# Patient Record
Sex: Female | Born: 1935 | Race: Black or African American | Hispanic: No | Smoking: Former smoker
Health system: Southern US, Community
[De-identification: ages and names within clinical notes are randomized; demographics above are authoritative.]

## PROBLEM LIST (undated history)

## (undated) DIAGNOSIS — I10 Essential (primary) hypertension: Secondary | ICD-10-CM

## (undated) DIAGNOSIS — E7800 Pure hypercholesterolemia, unspecified: Secondary | ICD-10-CM

## (undated) DIAGNOSIS — G4730 Sleep apnea, unspecified: Secondary | ICD-10-CM

## (undated) HISTORY — PX: TUBAL LIGATION: SHX77

---

## 2015-04-15 ENCOUNTER — Emergency Department (HOSPITAL_BASED_OUTPATIENT_CLINIC_OR_DEPARTMENT_OTHER): Payer: Medicare Other

## 2015-04-15 ENCOUNTER — Emergency Department (HOSPITAL_BASED_OUTPATIENT_CLINIC_OR_DEPARTMENT_OTHER)
Admission: EM | Admit: 2015-04-15 | Discharge: 2015-04-15 | Disposition: A | Discharge status: Home / Self Care | Payer: Medicare Other | Attending: Emergency Medicine | Admitting: Emergency Medicine

## 2015-04-15 ENCOUNTER — Encounter (HOSPITAL_BASED_OUTPATIENT_CLINIC_OR_DEPARTMENT_OTHER): Payer: Self-pay

## 2015-04-15 DIAGNOSIS — E7800 Pure hypercholesterolemia, unspecified: Secondary | ICD-10-CM | POA: Insufficient documentation

## 2015-04-15 DIAGNOSIS — Z79899 Other long term (current) drug therapy: Secondary | ICD-10-CM | POA: Insufficient documentation

## 2015-04-15 DIAGNOSIS — Y9389 Activity, other specified: Secondary | ICD-10-CM | POA: Insufficient documentation

## 2015-04-15 DIAGNOSIS — Z8669 Personal history of other diseases of the nervous system and sense organs: Secondary | ICD-10-CM | POA: Insufficient documentation

## 2015-04-15 DIAGNOSIS — W19XXXA Unspecified fall, initial encounter: Secondary | ICD-10-CM

## 2015-04-15 DIAGNOSIS — M25512 Pain in left shoulder: Secondary | ICD-10-CM

## 2015-04-15 DIAGNOSIS — Y998 Other external cause status: Secondary | ICD-10-CM | POA: Insufficient documentation

## 2015-04-15 DIAGNOSIS — S80212A Abrasion, left knee, initial encounter: Secondary | ICD-10-CM | POA: Diagnosis not present

## 2015-04-15 DIAGNOSIS — I10 Essential (primary) hypertension: Secondary | ICD-10-CM | POA: Diagnosis not present

## 2015-04-15 DIAGNOSIS — T148 Other injury of unspecified body region: Secondary | ICD-10-CM | POA: Diagnosis not present

## 2015-04-15 DIAGNOSIS — S50312A Abrasion of left elbow, initial encounter: Secondary | ICD-10-CM | POA: Diagnosis not present

## 2015-04-15 DIAGNOSIS — S4992XA Unspecified injury of left shoulder and upper arm, initial encounter: Secondary | ICD-10-CM | POA: Diagnosis not present

## 2015-04-15 DIAGNOSIS — Y92410 Unspecified street and highway as the place of occurrence of the external cause: Secondary | ICD-10-CM | POA: Diagnosis not present

## 2015-04-15 DIAGNOSIS — R911 Solitary pulmonary nodule: Secondary | ICD-10-CM | POA: Insufficient documentation

## 2015-04-15 DIAGNOSIS — T148XXA Other injury of unspecified body region, initial encounter: Secondary | ICD-10-CM

## 2015-04-15 HISTORY — DX: Essential (primary) hypertension: I10

## 2015-04-15 HISTORY — DX: Pure hypercholesterolemia, unspecified: E78.00

## 2015-04-15 HISTORY — DX: Sleep apnea, unspecified: G47.30

## 2015-04-15 MED ORDER — CYCLOBENZAPRINE HCL 10 MG PO TABS
10.0000 mg | ORAL_TABLET | Freq: Once | ORAL | Status: AC
  Administered 2015-04-15: 10 mg via ORAL
  Filled 2015-04-15: qty 1

## 2015-04-15 MED ORDER — CYCLOBENZAPRINE HCL 10 MG PO TABS: 10.0000 mg | ORAL_TABLET | Freq: Two times a day (BID) | ORAL | Status: AC | PRN

## 2015-04-15 NOTE — ED Notes (Signed)
D/c home with family. Rx x 1 given for flexeril. Ice pack given for home use

## 2015-04-15 NOTE — ED Notes (Signed)
Patient fell while getting out of car this am falling on left knee and left shoulder, pain with any ambulation

## 2015-04-15 NOTE — Discharge Instructions (Signed)
Discontinue your other muscle relaxant (tizanidine/zanaflex)  if you are going to take this one (flexeril.)    Heat Therapy Heat therapy can help ease sore, stiff, injured, and tight muscles and joints. Heat relaxes your muscles, which may help ease your pain.  RISKS AND COMPLICATIONS If you have any of the following conditions, do not use heat therapy unless your health care provider has approved:  Poor circulation.  Healing wounds or scarred skin in the area being treated.  Diabetes, heart disease, or high blood pressure.  Not being able to feel (numbness) the area being treated.  Unusual swelling of the area being treated.  Active infections.  Blood clots.  Cancer.  Inability to communicate pain. This may include young children and people who have problems with their brain function (dementia).  Pregnancy. Heat therapy should only be used on old, pre-existing, or long-lasting (chronic) injuries. Do not use heat therapy on new injuries unless directed by your health care provider. HOW TO USE HEAT THERAPY There are several different kinds of heat therapy, including:  Moist heat pack.  Warm water bath.  Hot water bottle.  Electric heating pad.  Heated gel pack.  Heated wrap.  Electric heating pad. Use the heat therapy method suggested by your health care provider. Follow your health care provider's instructions on when and how to use heat therapy. GENERAL HEAT THERAPY RECOMMENDATIONS  Do not sleep while using heat therapy. Only use heat therapy while you are awake.  Your skin may turn pink while using heat therapy. Do not use heat therapy if your skin turns red.  Do not use heat therapy if you have new pain.  High heat or long exposure to heat can cause burns. Be careful when using heat therapy to avoid burning your skin.  Do not use heat therapy on areas of your skin that are already irritated, such as with a rash or sunburn. SEEK MEDICAL CARE IF:  You have  blisters, redness, swelling, or numbness.  You have new pain.  Your pain is worse. MAKE SURE YOU:  Understand these instructions.  Will watch your condition.  Will get help right away if you are not doing well or get worse.   This information is not intended to replace advice given to you by your health care provider. Make sure you discuss any questions you have with your health care provider.   Document Released: 04/14/2011 Document Revised: 02/10/2014 Document Reviewed: 03/15/2013 Elsevier Interactive Patient Education [employer]2016 [employer] Inc.

## 2015-04-15 NOTE — ED Provider Notes (Signed)
CSN: [id number]     Arrival date & time 04/15/15  1159 History   First MD Initiated Contact with Patient 04/15/15 1414     Chief Complaint  Patient presents with  . Fall     (Consider location/radiation/quality/duration/timing/severity/associated sxs/prior Treatment) HPI Comments: Getting out of the car at 11AM Then fell No LOC No head trauma, no blood thinners Fell on left knee and left shoulder Helped get up and get back into car Shoulder pain 10/10   Patient is a 81 y.o. female presenting with fall.  Fall Pertinent negatives include no chest pain, no abdominal pain, no headaches and no shortness of breath.    Past Medical History  Diagnosis Date  . Hypertension   . High cholesterol   . Sleep apnea    History reviewed. No pertinent past surgical history. No family history on file. Social History  Substance Use Topics  . Smoking status: Never Smoker   . Smokeless tobacco: None  . Alcohol Use: None   OB History    No data available     Review of Systems  Constitutional: Negative for fever.  HENT: Positive for rhinorrhea (allergies taking allergy pills). Negative for sore throat.   Eyes: Negative for visual disturbance.  Respiratory: Negative for cough and shortness of breath.   Cardiovascular: Negative for chest pain.  Gastrointestinal: Negative for nausea, vomiting and abdominal pain.  Genitourinary: Negative for difficulty urinating.  Musculoskeletal: Positive for arthralgias. Negative for back pain and neck pain.  Skin: Negative for rash.  Neurological: Negative for syncope, facial asymmetry, speech difficulty, weakness, numbness and headaches.      Allergies  Review of patient's allergies indicates not on file.  Home Medications   Prior to Admission medications   Medication Sig Start Date End Date Taking? Authorizing Provider  acetaminophen (TYLENOL) 325 MG tablet Take 650 mg by mouth every 6 (six) hours as needed.   Yes Historical Provider, MD   cyclobenzaprine (FLEXERIL) 10 MG tablet Take 1 tablet (10 mg total) by mouth 2 (two) times daily as needed for muscle spasms. 04/15/15   Alvira MondayErin Ryen Rhames, MD  lisinopril (PRINIVIL,ZESTRIL) 30 MG tablet Take 30 mg by mouth daily.   Yes Historical Provider, MD  simvastatin (ZOCOR) 20 MG tablet Take 20 mg by mouth daily.   Yes Historical Provider, MD   BP 149/94 mmHg  Pulse 82  Temp(Src) 98.6 F (37 C) (Oral)  Resp 20  Ht 5\' 5"  (1.651 m)  Wt 200 lb (90.719 kg)  BMI 33.28 kg/m2  SpO2 100% Physical Exam  Constitutional: She is oriented to person, place, and time. She appears well-developed and well-nourished. No distress.  HENT:  Head: Normocephalic and atraumatic.  Eyes: Conjunctivae and EOM are normal.  Neck: Normal range of motion.  Cardiovascular: Normal rate, regular rhythm, normal heart sounds and intact distal pulses.  Exam reveals no gallop and no friction rub.   No murmur heard. Pulmonary/Chest: Effort normal and breath sounds normal. No respiratory distress. She has no wheezes. She has no rales.  Abdominal: Soft. She exhibits no distension. There is no tenderness. There is no guarding.  Musculoskeletal: She exhibits no edema.       Left shoulder: She exhibits decreased range of motion (flexion at shoulder, pain with abduction), tenderness, bony tenderness and spasm. She exhibits no effusion, no deformity, no laceration and normal strength.       Left elbow: She exhibits normal range of motion, no swelling, no effusion and no deformity. No tenderness found.  Left wrist: She exhibits normal range of motion, no tenderness and no bony tenderness.       Cervical back: She exhibits tenderness (left trapezius smas, tenderness). She exhibits normal range of motion and no bony tenderness.  Neurological: She is alert and oriented to person, place, and time. She has normal strength. No cranial nerve deficit or sensory deficit. Coordination normal. GCS eye subscore is 4. GCS verbal subscore  is 5. GCS motor subscore is 6.  Skin: Skin is warm and dry. Abrasion (left knee, left elbow) noted. No rash noted. She is not diaphoretic. No erythema.  Nursing note and vitals reviewed.   ED Course  Procedures (including critical care time) Labs Review Labs Reviewed - No data to display  Imaging Review Dg Chest 2 View  04/15/2015  CLINICAL DATA:  With 2 week history of productive cough. EXAM: CHEST  2 VIEW COMPARISON:  None. FINDINGS: Two-view exam shows hyperexpansion with mild chronic interstitial coarsening. 15 mm oval density identified in the right parahilar lung. Nodular density overlying the anterior left first rib is probably related to mineralized costochondral cartilage. There is some scarring in the left mid lung and left lung base. No edema or focal airspace consolidation. Cardiopericardial silhouette is at upper limits of normal for size. The visualized bony structures of the thorax are intact. IMPRESSION: Right parahilar nodule. CT chest without contrast recommended to further evaluate. Electronically Signed   By: Kennith Center M.D.   On: 04/15/2015 15:19   Dg Shoulder Left  04/15/2015  CLINICAL DATA:  81 year old female with history of fall today while getting out of a car complaining of left shoulder and left knee pain. EXAM: LEFT SHOULDER - 2+ VIEW COMPARISON:  No priors. FINDINGS: Mild irregularity of the inferior aspect of the glenoid, favored to be degenerative. No definite acute displaced fracture, subluxation or dislocation. Mild joint space narrowing, subchondral sclerosis and osteophyte formation associated with the glenohumeral joint, compatible with mild osteoarthritis. IMPRESSION: 1. No acute radiographic abnormality of the left shoulder. 2. Mild left glenohumeral joint osteoarthritis. Electronically Signed   By: Trudie Reed M.D.   On: 04/15/2015 15:21   Dg Knee Complete 4 Views Left  04/15/2015  CLINICAL DATA:  Fall today getting out of car.  Left knee pain. EXAM:  LEFT KNEE - COMPLETE 4+ VIEW COMPARISON:  None. FINDINGS: Advanced degenerative changes within all 3 compartments of the left knee. No acute bony abnormality. Specifically, no fracture, subluxation, or dislocation. Soft tissues are intact. No joint effusion. IMPRESSION: Advanced degenerative changes.  No acute bony abnormality. Electronically Signed   By: Charlett Nose M.D.   On: 04/15/2015 15:20   I have personally reviewed and evaluated these images and lab results as part of my medical decision-making.   EKG Interpretation None      MDM   Final diagnoses:  Fall, initial encounter  Left shoulder pain  Contusion  Nodule of right lung   81yo female with history of hypertension, hyperlipidemia, OSA, presents with concern for mechanical fall from standing with left shoulder pain.  Patient denies LOC, head trauma, headache, nausea/vomiting, numbness/weakness, midline neck pain and doubt acute intracranial or intracervical pathology.  Patient with pain in left shoulder and left knee from fall.  Reports mild cough as well. XR ordered showing no signs of pneumonia, however did show pulmonary nodule for which we recommend close PCP follow up for outpatient CT.   XR of shoulder and knee without acute fracture.  Patient with likely contusion, muscle  strain, possible rotator cuff injury.  Patient neurovascularly intact.  Given flexeril with improvement.  Given shoulder sling for comfort.  Patient discharged in stable condition with understanding of reasons to return and flexeril rx.      Alvira Monday, MD 04/15/15 2125

## 2015-04-15 NOTE — ED Notes (Signed)
MD at bedside. 

## 2017-05-20 ENCOUNTER — Encounter (HOSPITAL_BASED_OUTPATIENT_CLINIC_OR_DEPARTMENT_OTHER): Payer: Self-pay | Admitting: *Deleted

## 2017-05-20 ENCOUNTER — Emergency Department (HOSPITAL_BASED_OUTPATIENT_CLINIC_OR_DEPARTMENT_OTHER)
Admission: EM | Admit: 2017-05-20 | Discharge: 2017-05-20 | Disposition: A | Discharge status: Home / Self Care | Payer: Medicare HMO | Attending: Emergency Medicine | Admitting: Emergency Medicine

## 2017-05-20 ENCOUNTER — Emergency Department (HOSPITAL_BASED_OUTPATIENT_CLINIC_OR_DEPARTMENT_OTHER): Payer: Medicare HMO

## 2017-05-20 ENCOUNTER — Other Ambulatory Visit: Payer: Self-pay

## 2017-05-20 DIAGNOSIS — Z87891 Personal history of nicotine dependence: Secondary | ICD-10-CM | POA: Insufficient documentation

## 2017-05-20 DIAGNOSIS — I10 Essential (primary) hypertension: Secondary | ICD-10-CM | POA: Diagnosis not present

## 2017-05-20 DIAGNOSIS — Y929 Unspecified place or not applicable: Secondary | ICD-10-CM | POA: Insufficient documentation

## 2017-05-20 DIAGNOSIS — Z79899 Other long term (current) drug therapy: Secondary | ICD-10-CM | POA: Diagnosis not present

## 2017-05-20 DIAGNOSIS — Y939 Activity, unspecified: Secondary | ICD-10-CM | POA: Insufficient documentation

## 2017-05-20 DIAGNOSIS — Y999 Unspecified external cause status: Secondary | ICD-10-CM | POA: Insufficient documentation

## 2017-05-20 DIAGNOSIS — S0003XA Contusion of scalp, initial encounter: Secondary | ICD-10-CM | POA: Diagnosis not present

## 2017-05-20 DIAGNOSIS — W19XXXA Unspecified fall, initial encounter: Secondary | ICD-10-CM | POA: Diagnosis not present

## 2017-05-20 NOTE — ED Triage Notes (Addendum)
Pt c/o fall from standing landing on cement hitting head, ? LOC , daughter states she was confused after the fall

## 2017-05-20 NOTE — ED Provider Notes (Signed)
[facility] EMERGENCY DEPARTMENT Provider Note   CSN: [id number] Arrival date & time: 05/20/17  2104     History   Chief Complaint Chief Complaint  Patient presents with  . Fall    HPI Holly Hess is a 83 y.o. female.  HPI Patient presents after fall.  Was in her kitchen then woke up on the floor.  Hit the back of her head.  Mild confusion after.  Quickly came back to normal however.  Does have a history of falls.  Had been doing well before.  No chest pain.  No trouble breathing.  No lightheadedness.  Has had falls before.  No history of syncope.  States she has some swelling on the back of her head.  Has been doing well recently.  No lightheadedness or dizziness. Past Medical History:  Diagnosis Date  . High cholesterol   . Hypertension   . Sleep apnea     There are no active problems to display for this patient.   Past Surgical History:  Procedure Laterality Date  . TUBAL LIGATION       OB History   None      Home Medications    Prior to Admission medications   Medication Sig Start Date End Date Taking? Authorizing Provider  acetaminophen (TYLENOL) 325 MG tablet Take 650 mg by mouth every 6 (six) hours as needed.    [provider]  cyclobenzaprine (FLEXERIL) 10 MG tablet Take 1 tablet (10 mg total) by mouth 2 (two) times daily as needed for muscle spasms. 04/15/15   Alvira Monday, MD  lisinopril (PRINIVIL,ZESTRIL) 30 MG tablet Take 30 mg by mouth daily.    [provider]  simvastatin (ZOCOR) 20 MG tablet Take 20 mg by mouth daily.    [provider]    Family History History reviewed. No pertinent family history.  Social History Social History   Tobacco Use  . Smoking status: Former Games developer  . Smokeless tobacco: Never Used  Substance Use Topics  . Alcohol use: Never    Frequency: Never  . Drug use: Never     Allergies   Patient has no known allergies.   Review of Systems Review of Systems    Constitutional: Negative for appetite change and fever.  HENT: Negative for congestion.   Respiratory: Negative for shortness of breath.   Gastrointestinal: Negative for abdominal pain.  Genitourinary: Negative for flank pain.  Musculoskeletal: Negative for neck stiffness.  Skin: Negative for wound.  Neurological: Positive for syncope. Negative for headaches.  Hematological: Negative for adenopathy.  Psychiatric/Behavioral: Negative for confusion.     Physical Exam Updated Vital Signs BP (!) 190/94 (BP Location: Left Arm)   Pulse 72   Temp 98.6 F (37 C) (Oral)   Resp 16   Ht 5\' 6"  (1.676 m)   Wt 88.9 kg (196 lb)   SpO2 100%   BMI 31.64 kg/m   Physical Exam  Constitutional: She appears well-developed.  HENT:  Head: Normocephalic.  Occipital hematoma.  Eyes: EOM are normal.  Neck: Neck supple.  Cardiovascular: Normal rate.  Pulmonary/Chest: Effort normal.  Abdominal: There is no tenderness.  Musculoskeletal: She exhibits no edema or tenderness.  Neurological: She is alert.  Skin: Skin is warm. Capillary refill takes less than 2 seconds.  Psychiatric: She has a normal mood and affect.     ED Treatments / Results  Labs (all labs ordered are listed, but only abnormal results are displayed) Labs Reviewed - No data  to display  EKG EKG Interpretation  Date/Time:  Wednesday May 20 2017 21:48:16 EDT Ventricular Rate:  74 PR Interval:    QRS Duration: 78 QT Interval:  377 QTC Calculation: 419 R Axis:   1 Text Interpretation:  Sinus rhythm Consider left atrial enlargement Low voltage, precordial leads Abnormal R-wave progression, early transition Confirmed by Benjiman CorePickering, Apostolos Blagg [phone]) on 05/20/2017 10:43:23 PM   Radiology Ct Head Wo Contrast  Result Date: 05/20/2017 CLINICAL DATA:  Status post fall, hitting head on cement. Question of loss of consciousness. Confusion and posterior left headache. EXAM: CT HEAD WITHOUT CONTRAST TECHNIQUE: Contiguous axial images  were obtained from the base of the skull through the vertex without intravenous contrast. COMPARISON:  None. FINDINGS: Brain: No evidence of acute infarction, hemorrhage, hydrocephalus, extra-axial collection or mass lesion / mass effect. Prominence of the ventricles and sulci reflects mild cortical volume loss. Mild cerebellar atrophy is noted. Scattered periventricular white matter change likely reflects small vessel ischemic microangiopathy. The brainstem and fourth ventricle are within normal limits. The basal ganglia are unremarkable in appearance. The cerebral hemispheres demonstrate grossly normal gray-white differentiation. No mass effect or midline shift is seen. Vascular: No hyperdense vessel or unexpected calcification. Skull: There is no evidence of fracture; visualized osseous structures are unremarkable in appearance. Sinuses/Orbits: The orbits are within normal limits. The paranasal sinuses and mastoid air cells are well-aerated. Other: Diffuse soft tissue swelling is noted at the posterior vertex and occiput. IMPRESSION: 1. No acute intracranial pathology seen on CT. 2. Diffuse soft tissue swelling at the posterior vertex and occiput. 3. Mild cortical volume loss and scattered small vessel ischemic microangiopathy. Electronically Signed   By: Roanna RaiderJeffery  Chang M.D.   On: 05/20/2017 22:13    Procedures Procedures (including critical care time)  Medications Ordered in ED Medications - No data to display   Initial Impression / Assessment and Plan / ED Course  I have reviewed the triage vital signs and the nursing notes.  Pertinent labs & imaging results that were available during my care of the patient were reviewed by me and considered in my medical decision making (see chart for details).     Patient with fall.  I think more likely a mechanical fall followed by confusion and some amnesia.  I think left likely it was a syncope.  EKG reassuring.  Discussed with patient and family and will  do a somewhat limited workup.  Has had no symptoms before.  Will discharge home.  Patient is  at her baseline.  Final Clinical Impressions(s) / ED Diagnoses   Final diagnoses:  Fall, initial encounter  Hematoma of scalp, initial encounter    ED Discharge Orders    None       Benjiman CorePickering, Jaylynn Mcaleer, MD 05/20/17 2315

## 2017-09-06 ENCOUNTER — Other Ambulatory Visit: Payer: Self-pay

## 2017-09-06 ENCOUNTER — Encounter (HOSPITAL_BASED_OUTPATIENT_CLINIC_OR_DEPARTMENT_OTHER): Payer: Self-pay | Admitting: Emergency Medicine

## 2017-09-06 ENCOUNTER — Emergency Department (HOSPITAL_BASED_OUTPATIENT_CLINIC_OR_DEPARTMENT_OTHER)
Admission: EM | Admit: 2017-09-06 | Discharge: 2017-09-06 | Disposition: A | Discharge status: Home / Self Care | Payer: Medicare HMO | Attending: Emergency Medicine | Admitting: Emergency Medicine

## 2017-09-06 ENCOUNTER — Emergency Department (HOSPITAL_BASED_OUTPATIENT_CLINIC_OR_DEPARTMENT_OTHER): Payer: Medicare HMO

## 2017-09-06 DIAGNOSIS — Z79899 Other long term (current) drug therapy: Secondary | ICD-10-CM | POA: Insufficient documentation

## 2017-09-06 DIAGNOSIS — M25562 Pain in left knee: Secondary | ICD-10-CM | POA: Diagnosis not present

## 2017-09-06 DIAGNOSIS — Z87891 Personal history of nicotine dependence: Secondary | ICD-10-CM | POA: Diagnosis not present

## 2017-09-06 DIAGNOSIS — I10 Essential (primary) hypertension: Secondary | ICD-10-CM | POA: Insufficient documentation

## 2017-09-06 DIAGNOSIS — M1710 Unilateral primary osteoarthritis, unspecified knee: Secondary | ICD-10-CM

## 2017-09-06 MED ORDER — IBUPROFEN 600 MG PO TABS: 600.0000 mg | ORAL_TABLET | Freq: Four times a day (QID) | ORAL | 0 refills | Status: AC | PRN

## 2017-09-06 MED ORDER — IBUPROFEN 800 MG PO TABS
800.0000 mg | ORAL_TABLET | Freq: Once | ORAL | Status: AC
  Administered 2017-09-06: 800 mg via ORAL
  Filled 2017-09-06: qty 1

## 2017-09-06 MED ORDER — TRAMADOL HCL 50 MG PO TABS
50.0000 mg | ORAL_TABLET | Freq: Once | ORAL | Status: AC
  Administered 2017-09-06: 50 mg via ORAL
  Filled 2017-09-06: qty 1

## 2017-09-06 MED ORDER — TRAMADOL HCL 50 MG PO TABS: 50.0000 mg | ORAL_TABLET | Freq: Four times a day (QID) | ORAL | 0 refills | Status: AC | PRN

## 2017-09-06 NOTE — ED Provider Notes (Signed)
[facility] EMERGENCY DEPARTMENT Provider Note   CSN: [id number] Arrival date & time: 09/06/17  1849     History   Chief Complaint Chief Complaint  Patient presents with  . Knee Pain    HPI Holly Hess is a 83 y.o. female hx of HTN, HL here with L knee pain. L knee pain for the last week. She saw her primary care doctor and was diagnosed with gout. She states that she was prescribed some medicines but can't remember what they were and they didn't help her with the pain. Denies any recent trama or injury. Denies any fever or chills.   The history is provided by the patient.    Past Medical History:  Diagnosis Date  . High cholesterol   . Hypertension   . Sleep apnea     There are no active problems to display for this patient.   Past Surgical History:  Procedure Laterality Date  . TUBAL LIGATION       OB History   None      Home Medications    Prior to Admission medications   Medication Sig Start Date End Date Taking? Authorizing Provider  acetaminophen (TYLENOL) 325 MG tablet Take 650 mg by mouth every 6 (six) hours as needed.    [provider]  cyclobenzaprine (FLEXERIL) 10 MG tablet Take 1 tablet (10 mg total) by mouth 2 (two) times daily as needed for muscle spasms. 04/15/15   Alvira Monday, MD  lisinopril (PRINIVIL,ZESTRIL) 30 MG tablet Take 30 mg by mouth daily.    [provider]  simvastatin (ZOCOR) 20 MG tablet Take 20 mg by mouth daily.    [provider]    Family History No family history on file.  Social History Social History   Tobacco Use  . Smoking status: Former Games developer  . Smokeless tobacco: Never Used  Substance Use Topics  . Alcohol use: Never    Frequency: Never  . Drug use: Never     Allergies   Patient has no known allergies.   Review of Systems Review of Systems  Musculoskeletal:       L knee pain   All other systems reviewed and are negative.    Physical Exam Updated Vital  Signs BP (!) 174/83 (BP Location: Right Arm)   Pulse 62   Temp 98.4 F (36.9 C) (Oral)   Resp 20   Ht 5\' 6"  (1.676 m)   Wt 86.2 kg (190 lb)   SpO2 99%   BMI 30.67 kg/m   Physical Exam  Constitutional: She appears well-developed.  HENT:  Head: Normocephalic.  Eyes: Pupils are equal, round, and reactive to light. Conjunctivae and EOM are normal.  Neck: Normal range of motion.  Cardiovascular: Normal rate.  Pulmonary/Chest: Effort normal.  Abdominal: Soft.  Musculoskeletal:  L knee slightly swollen, nl ROM, no erythema. No calf tenderness   Neurological: She is alert.  Skin: Skin is warm.  Psychiatric: She has a normal mood and affect.  Nursing note and vitals reviewed.    ED Treatments / Results  Labs (all labs ordered are listed, but only abnormal results are displayed) Labs Reviewed - No data to display  EKG None  Radiology Dg Knee Complete 4 Views Left  Result Date: 09/06/2017 CLINICAL DATA:  LEFT knee pain, no new injury EXAM: LEFT KNEE - COMPLETE 4+ VIEW COMPARISON:  04/15/2015 FINDINGS: Osseous demineralization. Tricompartmental osteoarthritic changes with joint space narrowing and spur formation least severe at the lateral  compartment. No acute fracture, dislocation, or bone destruction. No knee joint effusion. Scattered atherosclerotic calcifications. IMPRESSION: Osseous demineralization with tricompartmental osteoarthritic changes of the LEFT knee. No acute abnormalities. Electronically Signed   By: Ulyses SouthwardMark  Boles M.D.   On: 09/06/2017 22:42    Procedures Procedures (including critical care time)  Medications Ordered in ED Medications  traMADol (ULTRAM) tablet 50 mg (50 mg Oral Given 09/06/17 2126)  ibuprofen (ADVIL,MOTRIN) tablet 800 mg (800 mg Oral Given 09/06/17 2126)     Initial Impression / Assessment and Plan / ED Course  I have reviewed the triage vital signs and the nursing notes.  Pertinent labs & imaging results that were available during my care of  the patient were reviewed by me and considered in my medical decision making (see chart for details).     Holly Hess is a 83 y.o. female here with L knee pain. Xray showed arthritis. I think pain likely from arthritis. No evidence of gout or septic knee. Will dc home with motrin, ultram. Will have her follow up with ortho.    Final Clinical Impressions(s) / ED Diagnoses   Final diagnoses:  None    ED Discharge Orders    None       Charlynne PanderYao, Kylynn Street Hsienta, MD 09/06/17 2250

## 2017-09-06 NOTE — ED Notes (Signed)
Pt helped to bathroom. Walked w/o assistance. NAD.

## 2017-09-06 NOTE — ED Triage Notes (Signed)
L knee pain for several weeks. Denies injury. Has seen PCP without relief from interventions.

## 2017-09-06 NOTE — Discharge Instructions (Addendum)
Take motrin for pain   Take tramadol for severe pain.   See orthopedic doctor for follow up for your knee   Return to ER if you have severe knee pain, unable to walk on the leg, fever, worse swelling

## 2019-04-15 IMAGING — CR DG KNEE COMPLETE 4+V*L*
4 series · 4 of 4 positions shown · non-contrast
Comparison: 04/15/2015

CLINICAL DATA: LEFT knee pain, no new injury

EXAM:
LEFT KNEE - COMPLETE 4+ VIEW

[t knee ap left]
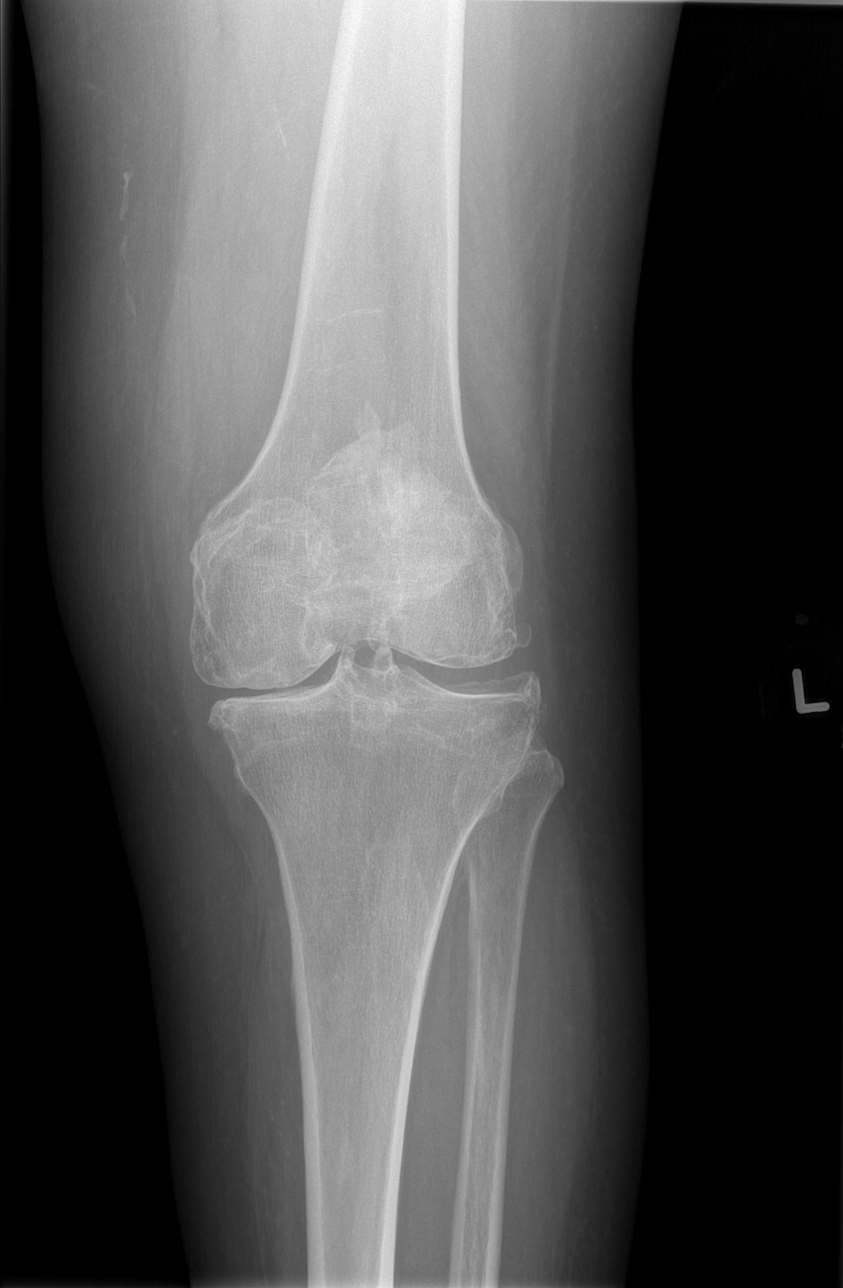

[t knee oblique left (1 of 2)]
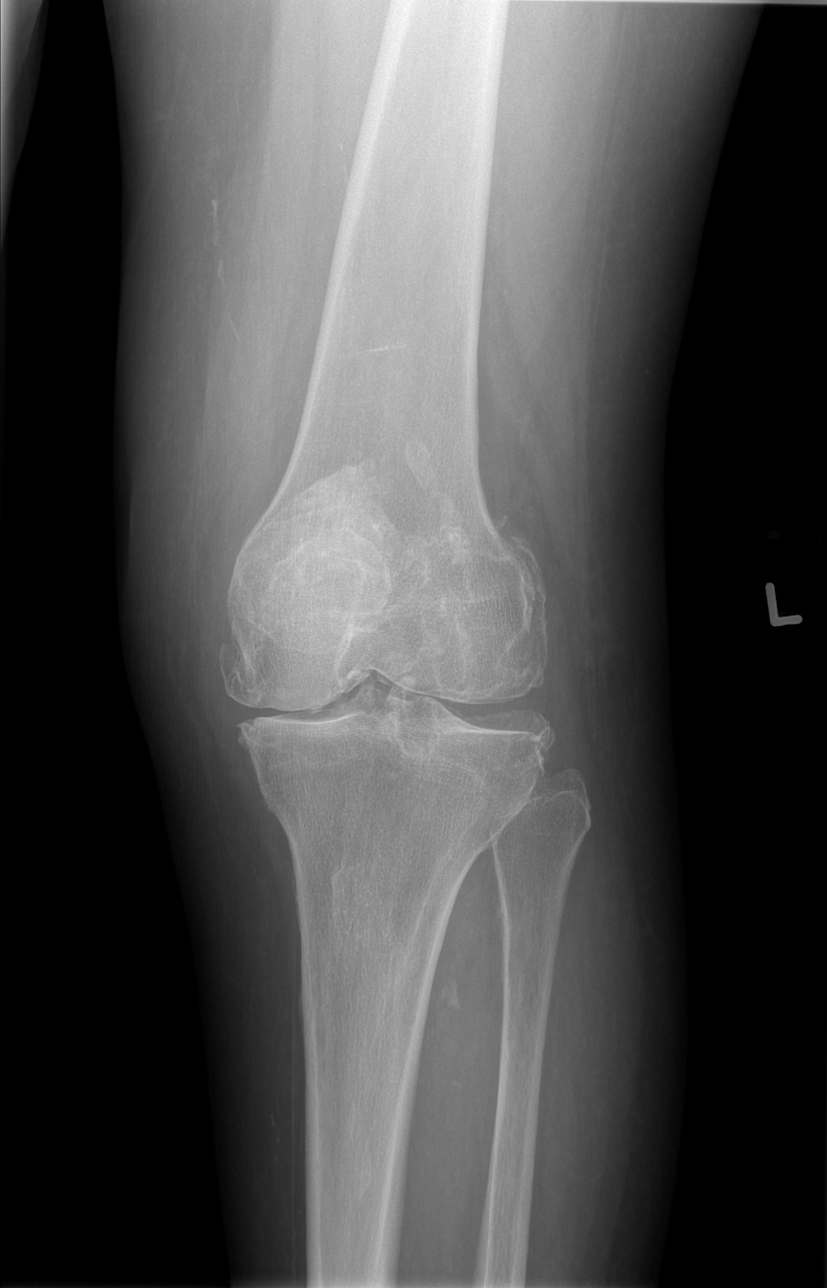

[t knee oblique left (2 of 2)]
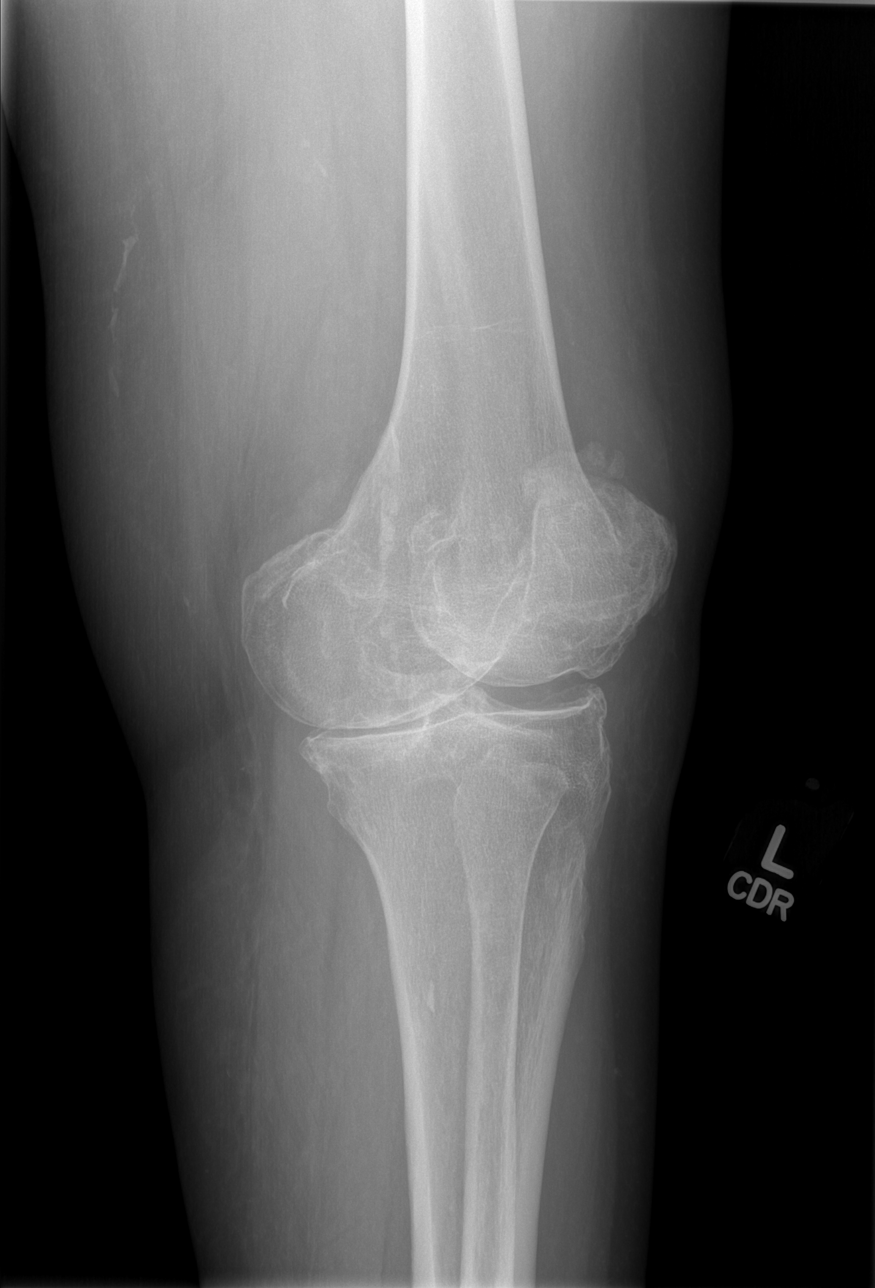

[t knee lat left]
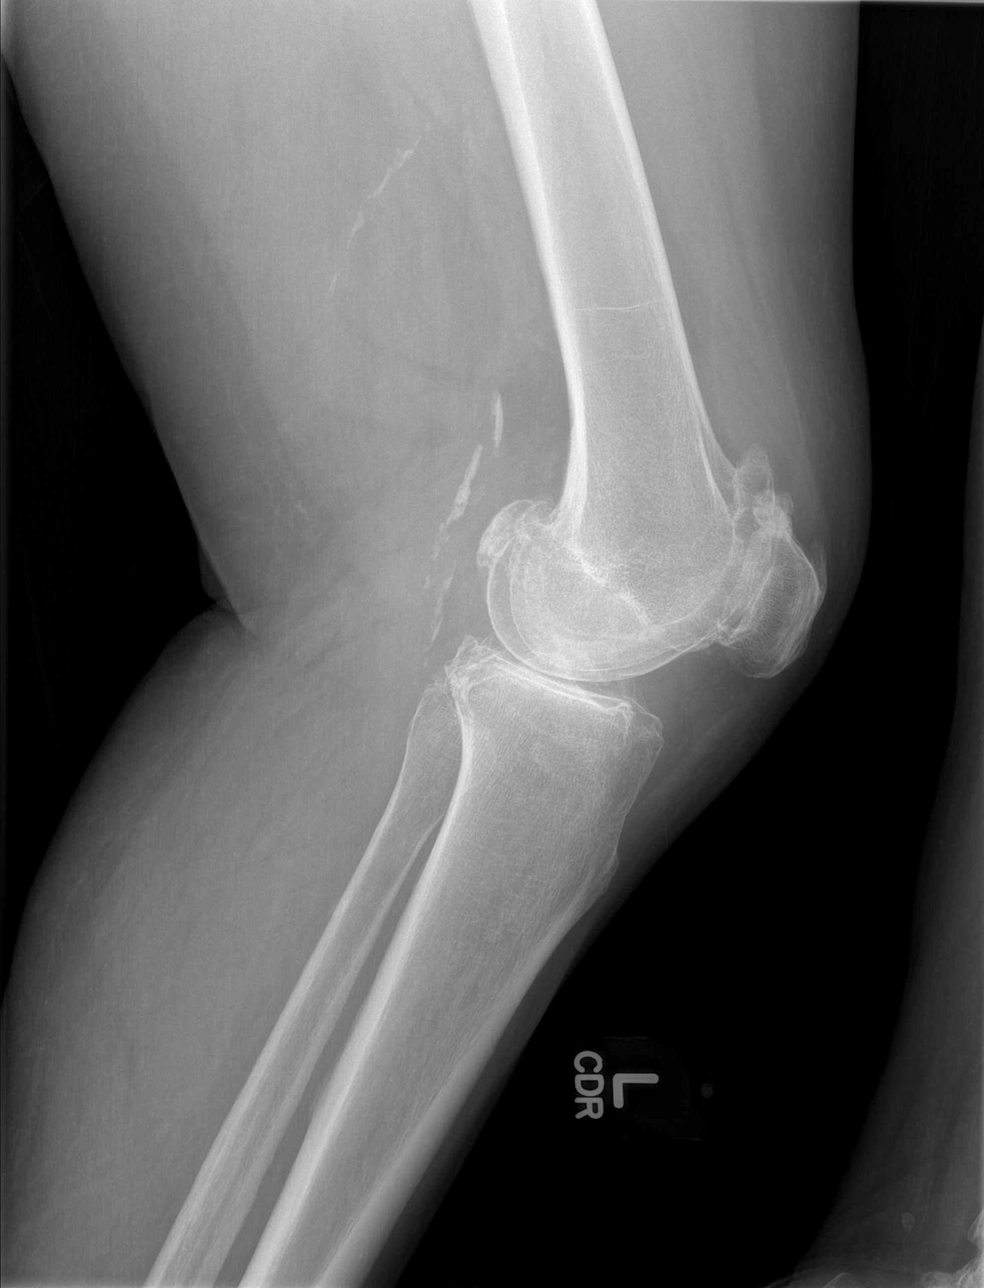

[4 of 4 positions shown; findings below may reference images not displayed]

FINDINGS: Osseous demineralization.

Tricompartmental osteoarthritic changes with joint space narrowing
and spur formation least severe at the lateral compartment.

No acute fracture, dislocation, or bone destruction.

No knee joint effusion.

Scattered atherosclerotic calcifications.
IMPRESSION: Osseous demineralization with tricompartmental osteoarthritic
changes of the LEFT knee.

No acute abnormalities.
